# Patient Record
Sex: Male | Born: 1975 | Race: White | Hispanic: No | Marital: Married | Smoking: Current every day smoker
Health system: Southern US, Community
[De-identification: ages and names within clinical notes are randomized; demographics above are authoritative.]

---

## 2018-05-14 ENCOUNTER — Emergency Department (HOSPITAL_COMMUNITY)
Admission: EM | Admit: 2018-05-14 | Discharge: 2018-05-15 | Disposition: A | Discharge status: Home / Self Care | Payer: No Typology Code available for payment source | Attending: Emergency Medicine | Admitting: Emergency Medicine

## 2018-05-14 ENCOUNTER — Emergency Department (HOSPITAL_COMMUNITY): Payer: No Typology Code available for payment source

## 2018-05-14 ENCOUNTER — Other Ambulatory Visit: Payer: Self-pay

## 2018-05-14 ENCOUNTER — Encounter (HOSPITAL_COMMUNITY): Payer: Self-pay

## 2018-05-14 DIAGNOSIS — Y9389 Activity, other specified: Secondary | ICD-10-CM | POA: Insufficient documentation

## 2018-05-14 DIAGNOSIS — F17210 Nicotine dependence, cigarettes, uncomplicated: Secondary | ICD-10-CM | POA: Insufficient documentation

## 2018-05-14 DIAGNOSIS — Y999 Unspecified external cause status: Secondary | ICD-10-CM | POA: Insufficient documentation

## 2018-05-14 DIAGNOSIS — S39012A Strain of muscle, fascia and tendon of lower back, initial encounter: Secondary | ICD-10-CM | POA: Insufficient documentation

## 2018-05-14 DIAGNOSIS — Y92410 Unspecified street and highway as the place of occurrence of the external cause: Secondary | ICD-10-CM | POA: Insufficient documentation

## 2018-05-14 DIAGNOSIS — Z79899 Other long term (current) drug therapy: Secondary | ICD-10-CM | POA: Insufficient documentation

## 2018-05-14 MED ORDER — KETOROLAC TROMETHAMINE 30 MG/ML IJ SOLN
30.0000 mg | Freq: Once | INTRAMUSCULAR | Status: AC
  Administered 2018-05-14: 30 mg via INTRAVENOUS
  Filled 2018-05-14: qty 1

## 2018-05-14 NOTE — ED Notes (Signed)
Patient transported to X-ray 
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
## 2018-05-15 MED ORDER — CYCLOBENZAPRINE HCL 10 MG PO TABS: 10.0000 mg | ORAL_TABLET | Freq: Two times a day (BID) | ORAL | 0 refills | Status: AC | PRN

## 2018-05-15 MED ORDER — CYCLOBENZAPRINE HCL 10 MG PO TABS
10.0000 mg | ORAL_TABLET | Freq: Once | ORAL | Status: AC
  Administered 2018-05-15: 10 mg via ORAL
  Filled 2018-05-15: qty 1
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
